# Patient Record
Sex: Male | Born: 1945 | Race: White | Hispanic: No | Marital: Married | Smoking: Former smoker
Health system: Southern US, Community
[De-identification: ages and names within clinical notes are randomized; demographics above are authoritative.]

## PROBLEM LIST (undated history)

## (undated) DIAGNOSIS — E785 Hyperlipidemia, unspecified: Secondary | ICD-10-CM

## (undated) DIAGNOSIS — I10 Essential (primary) hypertension: Secondary | ICD-10-CM

## (undated) HISTORY — PX: NECK SURGERY: SHX720

## (undated) HISTORY — PX: SHOULDER SURGERY: SHX246

---

## 2005-10-09 ENCOUNTER — Encounter: Admission: RE | Admit: 2005-10-09 | Discharge: 2005-10-09 | Payer: Self-pay | Admitting: Orthopedic Surgery

## 2015-08-19 DIAGNOSIS — M791 Myalgia: Secondary | ICD-10-CM | POA: Diagnosis not present

## 2015-08-19 DIAGNOSIS — M9904 Segmental and somatic dysfunction of sacral region: Secondary | ICD-10-CM | POA: Diagnosis not present

## 2015-08-19 DIAGNOSIS — M545 Low back pain: Secondary | ICD-10-CM | POA: Diagnosis not present

## 2015-08-19 DIAGNOSIS — M9903 Segmental and somatic dysfunction of lumbar region: Secondary | ICD-10-CM | POA: Diagnosis not present

## 2015-08-19 DIAGNOSIS — M9901 Segmental and somatic dysfunction of cervical region: Secondary | ICD-10-CM | POA: Diagnosis not present

## 2015-11-09 DIAGNOSIS — M545 Low back pain: Secondary | ICD-10-CM | POA: Diagnosis not present

## 2015-11-09 DIAGNOSIS — M791 Myalgia: Secondary | ICD-10-CM | POA: Diagnosis not present

## 2015-11-09 DIAGNOSIS — M9903 Segmental and somatic dysfunction of lumbar region: Secondary | ICD-10-CM | POA: Diagnosis not present

## 2015-11-09 DIAGNOSIS — M9901 Segmental and somatic dysfunction of cervical region: Secondary | ICD-10-CM | POA: Diagnosis not present

## 2015-11-09 DIAGNOSIS — M9904 Segmental and somatic dysfunction of sacral region: Secondary | ICD-10-CM | POA: Diagnosis not present

## 2015-11-19 DIAGNOSIS — M545 Low back pain: Secondary | ICD-10-CM | POA: Diagnosis not present

## 2015-11-19 DIAGNOSIS — M9901 Segmental and somatic dysfunction of cervical region: Secondary | ICD-10-CM | POA: Diagnosis not present

## 2015-11-19 DIAGNOSIS — M9903 Segmental and somatic dysfunction of lumbar region: Secondary | ICD-10-CM | POA: Diagnosis not present

## 2015-11-19 DIAGNOSIS — M791 Myalgia: Secondary | ICD-10-CM | POA: Diagnosis not present

## 2015-11-19 DIAGNOSIS — M9904 Segmental and somatic dysfunction of sacral region: Secondary | ICD-10-CM | POA: Diagnosis not present

## 2015-11-23 ENCOUNTER — Ambulatory Visit (INDEPENDENT_AMBULATORY_CARE_PROVIDER_SITE_OTHER): Payer: Medicare Other | Admitting: Family Medicine

## 2015-11-23 ENCOUNTER — Ambulatory Visit (INDEPENDENT_AMBULATORY_CARE_PROVIDER_SITE_OTHER): Payer: Medicare Other

## 2015-11-23 VITALS — BP 144/80 | HR 73 | Wt 182.0 lb

## 2015-11-23 DIAGNOSIS — M25562 Pain in left knee: Secondary | ICD-10-CM

## 2015-11-23 DIAGNOSIS — M25561 Pain in right knee: Secondary | ICD-10-CM

## 2015-11-23 DIAGNOSIS — M170 Bilateral primary osteoarthritis of knee: Secondary | ICD-10-CM | POA: Diagnosis not present

## 2015-11-23 NOTE — Assessment & Plan Note (Signed)
Likely meniscus tear. Patient had significant improvement following injection. Recheck in a few weeks. If not improved would consider MRI.

## 2015-11-23 NOTE — Patient Instructions (Signed)
Thank you for coming in today. Call or go to the ER if you develop a large red swollen joint with extreme pain or oozing puss.  Return in 2-4 weeks.   Meniscus Tear With Phase I Rehab The meniscus is a C-shaped cartilage structure, located in the knee joint between the thigh bone (femur) and the shinbone (tibia). Two menisci are located in each knee joint: the inner and outer meniscus. The meniscus acts as an adapter between the thigh bone and shinbone, allowing them to fit properly together. It also functions as a shock absorber, to reduce the stress placed on the knee joint and to help supply nutrients to the knee joint cartilage. As people age, the meniscus begins to harden and become more vulnerable to injury. Meniscus tears are a common injury, especially in older athletes. Inner meniscus tears are more common than outer meniscus tears.  SYMPTOMS   Pain in the knee, especially with standing or squatting with the affected leg.  Tenderness along the joint line.  Swelling in the knee joint (effusion), usually starting 1 to 2 days after injury.  Locking or catching of the knee joint, causing inability to straighten the knee completely.  Giving way or buckling of the knee. CAUSES  A meniscus tear occurs when a force is placed on the meniscus that is greater than it can handle. Common causes of injury include:  Direct hit (trauma) to the knee.  Twisting, pivoting, or cutting (rapidly changing direction while running), kneeling or squatting.  Without injury, due to aging. RISK INCREASES WITH:  Contact sports (football, rugby).  Sports in which cleats are used with pivoting (soccer, lacrosse) or sports in which good shoe grip and sudden change in direction are required (racquetball, basketball, squash).  Previous knee injury.  Associated knee injury, particularly ligament injuries.  Poor strength and flexibility. PREVENTION  Warm up and stretch properly before activity.  Maintain  physical fitness:  Strength, flexibility, and endurance.  Cardiovascular fitness.  Protect the knee with a brace or elastic bandage.  Wear properly fitted protective equipment (proper cleats for the surface). PROGNOSIS  Sometimes, meniscus tears heal on their own. However, definitive treatment requires surgery, followed by at least 6 weeks of recovery.  RELATED COMPLICATIONS   Recurring symptoms that result in a chronic problem.  Repeated knee injury, especially if sports are resumed too soon after injury or surgery.  Progression of the tear (the tear gets larger), if untreated.  Arthritis of the knee in later years (with or without surgery).  Complications of surgery, including infection, bleeding, injury to nerves (numbness, weakness, paralysis) continued pain, giving way, locking, nonhealing of meniscus (if repaired), need for further surgery, and knee stiffness (loss of motion). TREATMENT  Treatment first involves the use of ice and medicine, to reduce pain and inflammation. You may find using crutches to walk more comfortable. However, it is okay to bear weight on the injured knee, if the pain will allow it. Surgery is often advised as a definitive treatment. Surgery is performed through an incision near the joint (arthroscopically). The torn piece of the meniscus is removed, and if possible the joint cartilage is repaired. After surgery, the joint must be restrained. After restraint, it is important to perform strengthening and stretching exercises to help regain strength and a full range of motion. These exercises may be completed at home or with a therapist.  MEDICATION  If pain medicine is needed, nonsteroidal anti-inflammatory medicines (aspirin and ibuprofen), or other minor pain relievers (acetaminophen),  are often advised.  Do not take pain medicine for 7 days before surgery.  Prescription pain relievers may be given, if your caregiver thinks they are needed. Use only as  directed and only as much as you need. HEAT AND COLD  Cold treatment (icing) should be applied for 10 to 15 minutes every 2 to 3 hours for inflammation and pain, and immediately after activity that aggravates your symptoms. Use ice packs or an ice massage.  Heat treatment may be used before performing stretching and strengthening activities prescribed by your caregiver, physical therapist, or athletic trainer. Use a heat pack or a warm water soak. SEEK MEDICAL CARE IF:   Symptoms get worse or do not improve in 2 weeks, despite treatment.  New, unexplained symptoms develop. (Drugs used in treatment may produce side effects.) EXERCISES RANGE OF MOTION (ROM) AND STRETCHING EXERCISES - Meniscus Tear, Non-operative, Phase I These are some of the initial exercises with which you may start your rehabilitation program, until you see your caregiver again or until your symptoms are resolved. Remember:   These initial exercises are intended to be gentle. They will help you restore motion without increasing any swelling.  Completing these exercises allows less painful movement and prepares you for the more aggressive strengthening exercises in Phase II.  An effective stretch should be held for at least 30 seconds.  A stretch should never be painful. You should only feel a gentle lengthening or release in the stretched tissue. RANGE OF MOTION - Knee Flexion, Active  Lie on your back with both knees straight. (If this causes back discomfort, bend your healthy knee, placing your foot flat on the floor.)  Slowly slide your heel back toward your buttocks until you feel a gentle stretch in the front of your knee or thigh.  Hold for __________ seconds. Slowly slide your heel back to the starting position. Repeat __________ times. Complete this exercise __________ times per day.  RANGE OF MOTION - Knee Flexion and Extension, Active-Assisted  Sit on the edge of a table or chair with your thighs firmly  supported. It may be helpful to place a folded towel under the end of your right / left thigh.  Flexion (bending): Place the ankle of your healthy leg on top of the other ankle. Use your healthy leg to gently bend your right / left knee until you feel a mild tension across the top of your knee.  Hold for __________ seconds.  Extension (straightening): Switch your ankles so your right / left leg is on top. Use your healthy leg to straighten your right / left knee until you feel a mild tension on the backside of your knee.  Hold for __________ seconds. Repeat __________ times. Complete __________ times per day. STRETCH - Knee Flexion, Supine  Lie on the floor with your right / left heel and foot lightly touching the wall. (Place both feet on the wall if you do not use a door frame.)  Without using any effort, allow gravity to slide your foot down the wall slowly until you feel a gentle stretch in the front of your right / left knee.  Hold this stretch for __________ seconds. Then return the leg to the starting position, using your healthy leg for help, if needed. Repeat __________ times. Complete this stretch __________ times per day.  STRETCH - Knee Extension Sitting  Sit with your right / left leg/heel propped on another chair, coffee table, or foot stool.  Allow your leg muscles  to relax, letting gravity straighten out your knee.*  You should feel a stretch behind your right / left knee. Hold this position for __________ seconds. Repeat __________ times. Complete this stretch __________ times per day.  *Your physician, physical therapist or athletic trainer may instruct you place a __________ weight on your thigh, just above your kneecap, to deepen the stretch.  STRENGTHENING EXERCISES - Meniscus Tear, Non-operative, Phase I These exercises may help you when beginning to rehabilitate your injury. They may resolve your symptoms with or without further involvement from your physician,  physical therapist or athletic trainer. While completing these exercises, remember:   Muscles can gain both the endurance and the strength needed for everyday activities through controlled exercises.  Complete these exercises as instructed by your physician, physical therapist or athletic trainer. Progress the resistance and repetitions only as guided. STRENGTH - Quadriceps, Isometrics  Lie on your back with your right / left leg extended and your opposite knee bent.  Gradually tense the muscles in the front of your right / left thigh. You should see either your knee cap slide up toward your hip or increased dimpling just above the knee. This motion will push the back of the knee down toward the floor, mat, or bed on which you are lying.  Hold the muscle as tight as you can, without increasing your pain, for __________ seconds.  Relax the muscles slowly and completely between each repetition. Repeat __________ times. Complete this exercise __________ times per day.  STRENGTH - Quadriceps, Short Arcs   Lie on your back. Place a __________ inch towel roll under your right / left knee, so that the knee bends slightly.  Raise only your lower leg by tightening the muscles in the front of your thigh. Do not allow your thigh to rise.  Hold this position for __________ seconds. Repeat __________ times. Complete this exercise __________ times per day.  OPTIONAL ANKLE WEIGHTS: Begin with ____________________, but DO NOT exceed ____________________. Increase in 1 pound/0.5 kilogram increments. STRENGTH - Quadriceps, Straight Leg Raises  Quality counts! Watch for signs that the quadriceps muscle is working, to be sure you are strengthening the correct muscles and not "cheating" by substituting with healthier muscles.  Lay on your back with your right / left leg extended and your opposite knee bent.  Tense the muscles in the front of your right / left thigh. You should see either your knee cap slide  up or increased dimpling just above the knee. Your thigh may even shake a bit.  Tighten these muscles even more and raise your leg 4 to 6 inches off the floor. Hold for __________ seconds.  Keeping these muscles tense, lower your leg.  Relax the muscles slowly and completely in between each repetition. Repeat __________ times. Complete this exercise __________ times per day.  STRENGTH - Hamstring, Curls   Lay on your stomach with your legs extended. (If you lay on a bed, your feet may hang over the edge.)  Tighten the muscles in the back of your thigh to bend your right / left knee up to 90 degrees. Keep your hips flat on the bed.  Hold this position for __________ seconds.  Slowly lower your leg back to the starting position. Repeat __________ times. Complete this exercise __________ times per day.  STRENGTH - Quadriceps, Squats  Stand in a door frame so that your feet and knees are in line with the frame.  Use your hands for balance, not support, on the  frame.  Slowly lower your weight, bending at the hips and knees. Keep your lower legs upright so that they are parallel with the door frame. Squat only within the range that does not increase your knee pain. Never let your hips drop below your knees.  Slowly return upright, pushing with your legs, not pulling with your hands. Repeat __________ times. Complete this exercise __________ times per day.  STRENGTH - Quad/VMO, Isometric   Sit in a chair with your right / left knee slightly bent. With your fingertips, feel the VMO muscle just above the inside of your knee. The VMO is important in controlling the position of your kneecap.  Keeping your fingertips on this muscle. Without actually moving your leg, attempt to drive your knee down as if straightening your leg. You should feel your VMO tense. If you have a difficult time, you may wish to try the same exercise on your healthy knee first.  Tense this muscle as hard as you can  without increasing any knee pain.  Hold for __________ seconds. Relax the muscles slowly and completely in between each repetition. Repeat __________ times. Complete exercise __________ times per day.    This information is not intended to replace advice given to you by your health care provider. Make sure you discuss any questions you have with your health care provider.   Document Released: 08/15/1998 Document Revised: 12/16/2014 Document Reviewed: 11/13/2008 Elsevier Interactive Patient Education [employer].

## 2015-11-23 NOTE — Progress Notes (Signed)
   Cody Ware is a 70 y.o. male who presents to [facility] today for left knee pain. Patient has a two-week history of left medial knee pain. Symptoms started after playing golf. He denies any specific injury locking catching or giving way. Symptoms are moderate. He's tried some over-the-counter medicines have helped a bit. He denies any significant swelling.   No past medical history on file. No past surgical history on file. Social History  Substance Use Topics  . Smoking status: Not on file  . Smokeless tobacco: Not on file  . Alcohol Use: Not on file   family history is not on file.  ROS:  No headache, visual changes, nausea, vomiting, diarrhea, constipation, dizziness, abdominal pain, skin rash, fevers, chills, night sweats, weight loss, swollen lymph nodes, body aches, joint swelling, muscle aches, chest pain, shortness of breath, mood changes, visual or auditory hallucinations.    Medications: Current Outpatient Prescriptions  Medication Sig Dispense Refill  . amLODipine (NORVASC) 5 MG tablet     . atorvastatin (LIPITOR) 10 MG tablet     . fenofibrate 160 MG tablet     . lisinopril-hydrochlorothiazide (PRINZIDE,ZESTORETIC) 20-12.5 MG tablet      No current facility-administered medications for this visit.   No Known Allergies   Exam:  BP 144/80 mmHg  Pulse 73  Wt 182 lb (82.555 kg) General: Well Developed, well nourished, and in no acute distress.  Neuro/Psych: Alert and oriented x3, extra-ocular muscles intact, able to move all 4 extremities, sensation grossly intact. Skin: Warm and dry, no rashes noted.  Respiratory: Not using accessory muscles, speaking in full sentences, trachea midline.  Cardiovascular: Pulses palpable, no extremity edema. Abdomen: Does not appear distended. MSK: Left knee relatively normal appearing no significant effusion. Range of motion 0-120 with 1+ retropatellar crepitation Tender to  palpation medial joint line. Nontender lateral joint line. Negative McMurray's testing but positive Thessaly's tests. Stable ligamentous exam to valgus and varus stress testing. Normal anterior posterior drawer test. Normal gait.   X-ray left knee: Minimal DJD  Procedure: Real-time Ultrasound Guided Injection of left knee  Device: GE Logiq E  Images permanently stored and available for review in the ultrasound unit. Verbal informed consent obtained. Discussed risks and benefits of procedure. Warned about infection bleeding damage to structures skin hypopigmentation and fat atrophy among others. Patient expresses understanding and agreement Time-out conducted.  Noted no overlying erythema, induration, or other signs of local infection.  Skin prepped in a sterile fashion.  Local anesthesia: Topical Ethyl chloride.  With sterile technique and under real time ultrasound guidance: 80 mg of Kenalog and 4 mL of Marcaine injected easily.  Completed without difficulty  Pain immediately resolved suggesting accurate placement of the medication.  Advised to call if fevers/chills, erythema, induration, drainage, or persistent bleeding.  Images permanently stored and available for review in the ultrasound unit.  Impression: Technically successful ultrasound guided injection.     No results found for this or any previous visit (from the past 24 hour(s)). No results found.   Please see individual assessment and plan sections.

## 2015-11-24 NOTE — Progress Notes (Signed)
Quick Note:  Mild arthritis is present. ______

## 2016-01-14 DIAGNOSIS — I10 Essential (primary) hypertension: Secondary | ICD-10-CM | POA: Diagnosis not present

## 2016-01-14 DIAGNOSIS — Z1159 Encounter for screening for other viral diseases: Secondary | ICD-10-CM | POA: Diagnosis not present

## 2016-01-14 DIAGNOSIS — Z79899 Other long term (current) drug therapy: Secondary | ICD-10-CM | POA: Diagnosis not present

## 2016-01-14 DIAGNOSIS — Z125 Encounter for screening for malignant neoplasm of prostate: Secondary | ICD-10-CM | POA: Diagnosis not present

## 2016-01-14 DIAGNOSIS — E785 Hyperlipidemia, unspecified: Secondary | ICD-10-CM | POA: Diagnosis not present

## 2016-03-01 DIAGNOSIS — M7671 Peroneal tendinitis, right leg: Secondary | ICD-10-CM | POA: Diagnosis not present

## 2016-03-01 DIAGNOSIS — M722 Plantar fascial fibromatosis: Secondary | ICD-10-CM | POA: Diagnosis not present

## 2016-03-28 DIAGNOSIS — Z23 Encounter for immunization: Secondary | ICD-10-CM | POA: Diagnosis not present

## 2016-04-25 IMAGING — CR DG KNEE 1-2V*R*
4 series · 4 of 4 positions shown · non-contrast
Comparison: None in PACs

CLINICAL DATA: Medial left knee pain for several months, right knee
images obtained for comparison. No report of injury.

EXAM:
RIGHT KNEE - 1-2 VIEW; LEFT KNEE - COMPLETE 4+ VIEW

[knee ap]
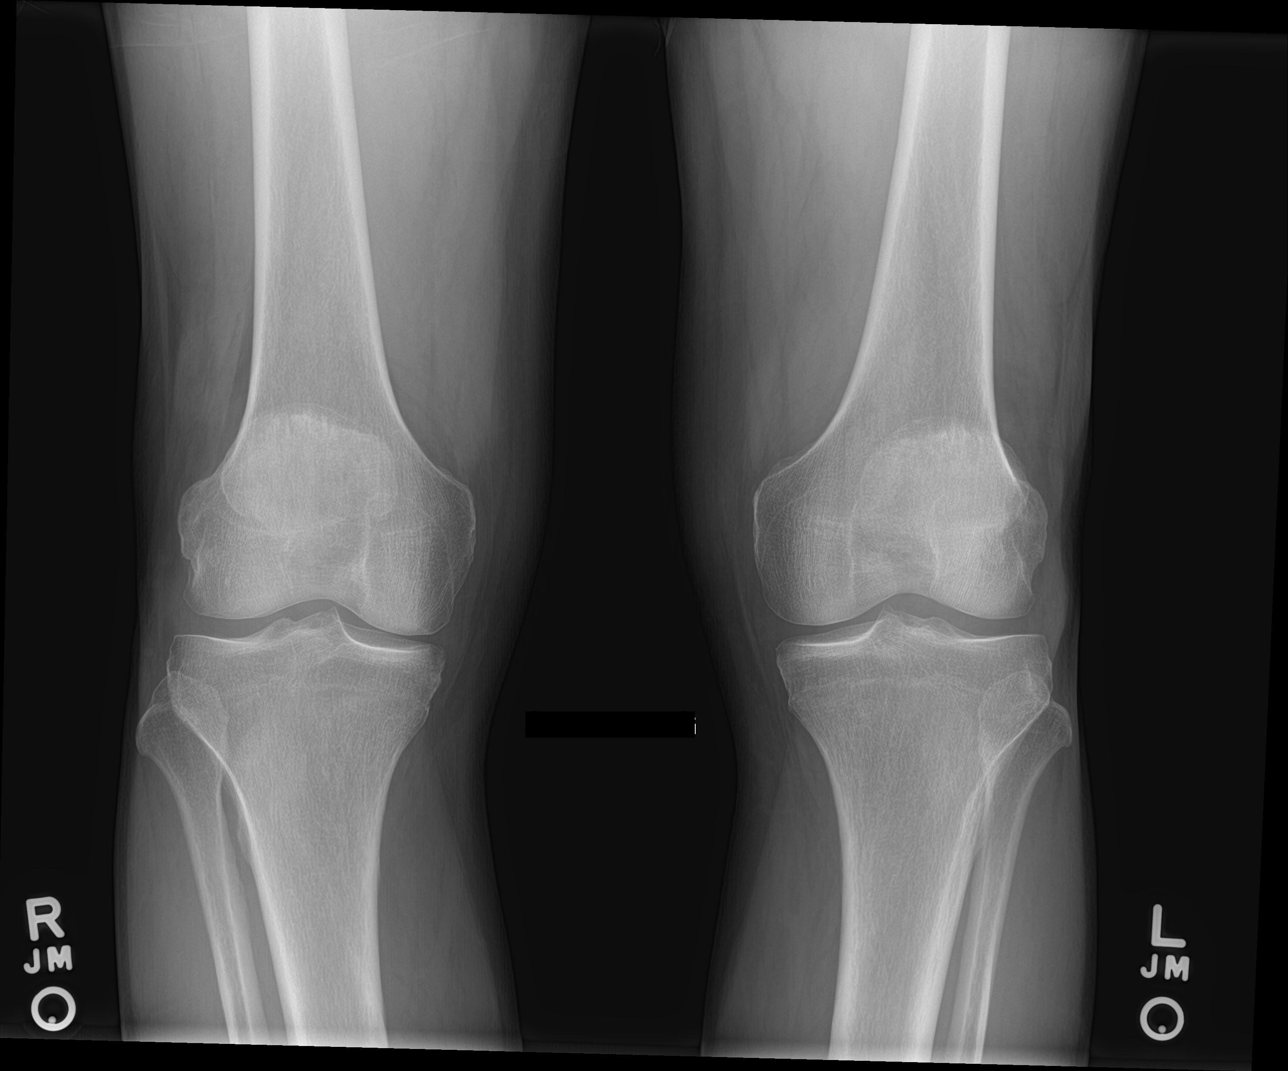

[tunnel]
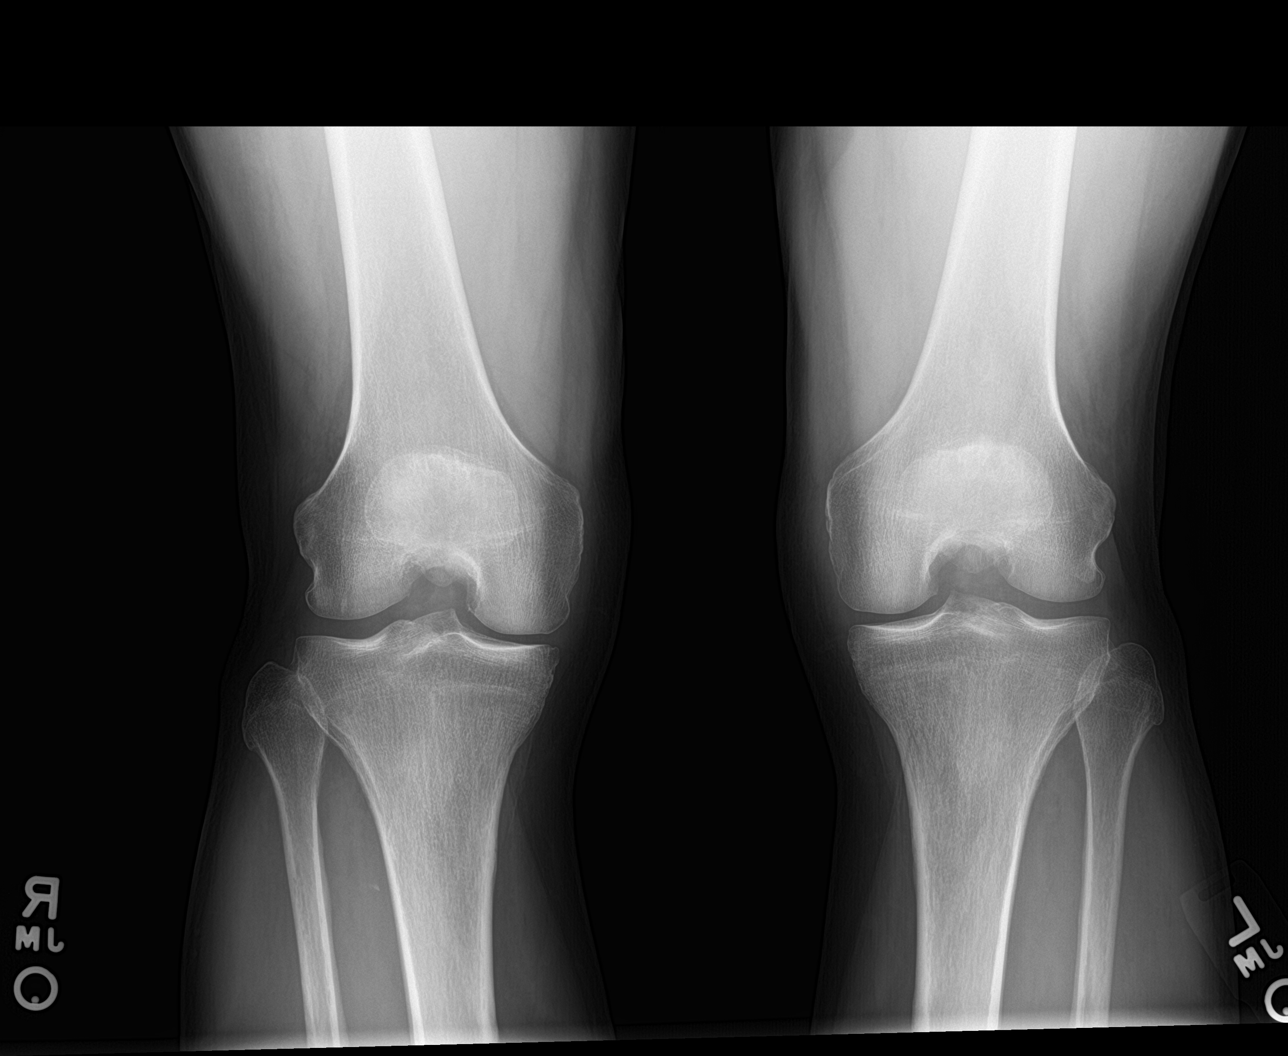

[knee lat]
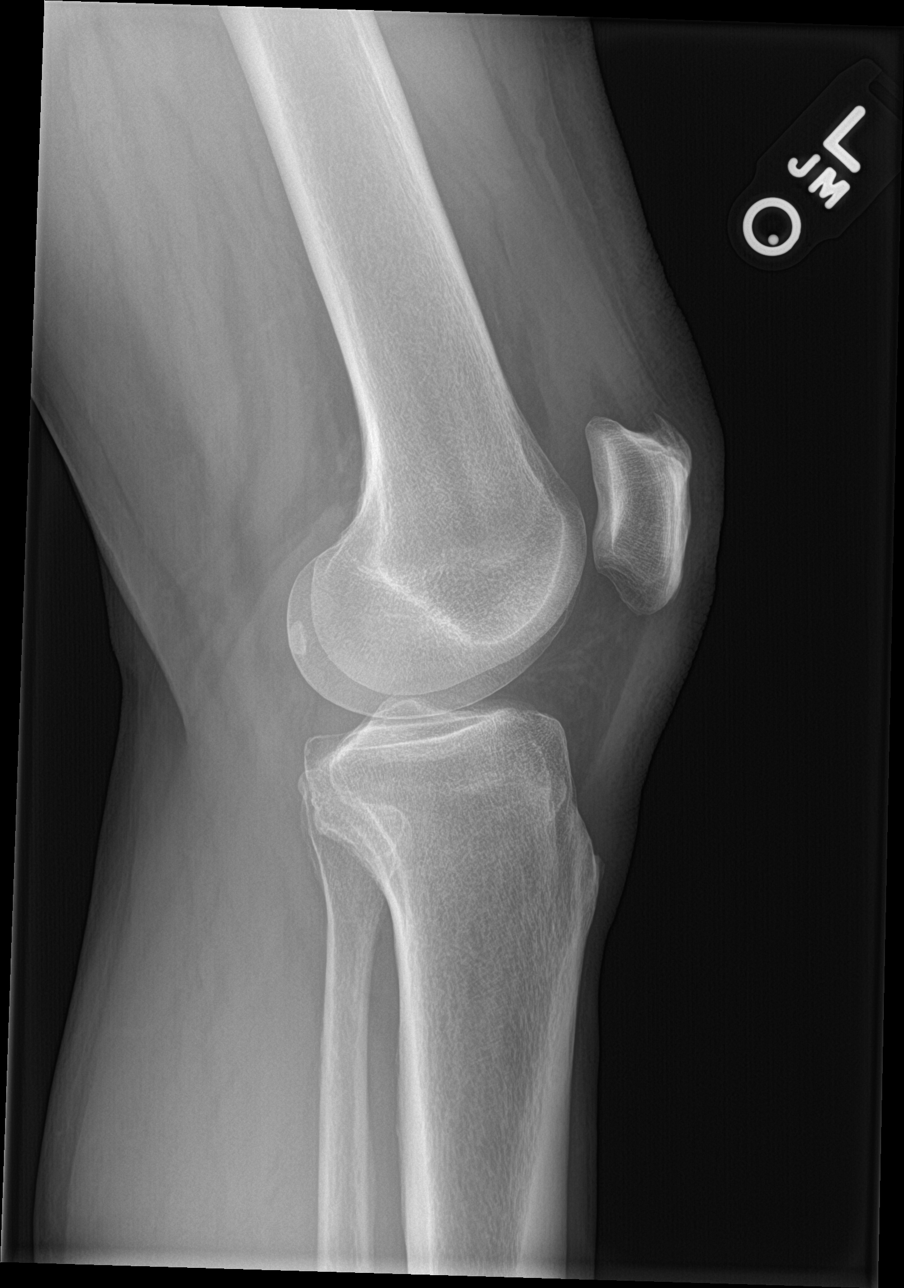

[knee sunrise]
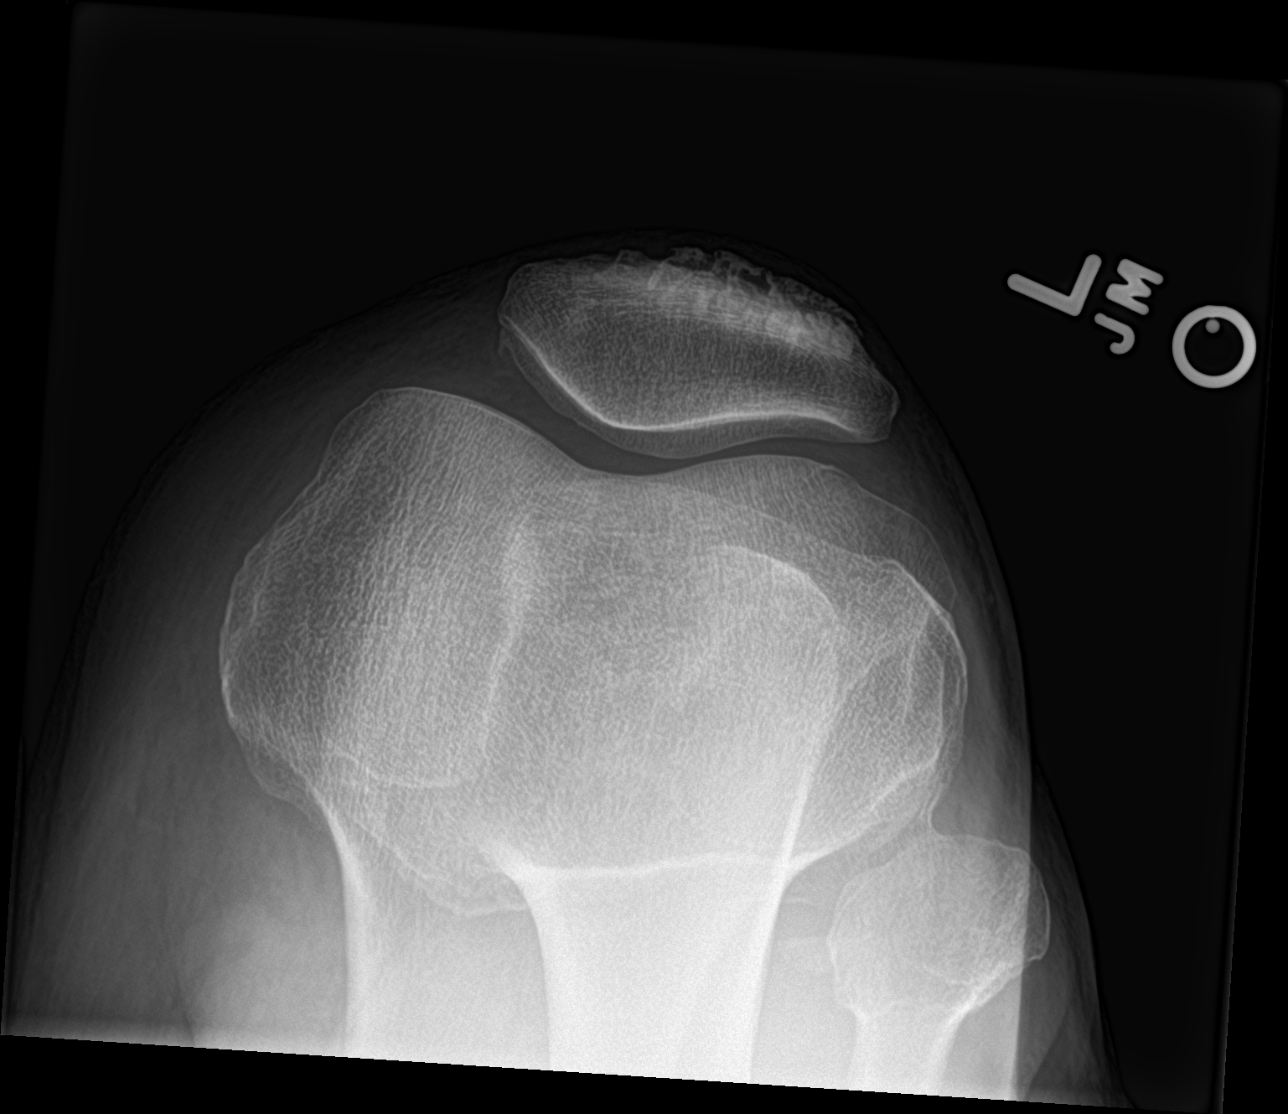

[4 of 4 positions shown; findings below may reference images not displayed]

FINDINGS: The bones are adequately mineralized. There is mild narrowing of the
medial joint spaces bilaterally. There is beaking of the medial
tibial spines bilaterally. The proximal fibulas are intact. There is
no acute fracture nor dislocation of the left knee. There is no
joint effusion.
IMPRESSION: There is mild degenerative narrowing of the medial joint
compartments bilaterally. There is no acute bony abnormality of the
left knee.

## 2016-05-11 DIAGNOSIS — J029 Acute pharyngitis, unspecified: Secondary | ICD-10-CM | POA: Diagnosis not present

## 2016-05-11 DIAGNOSIS — J018 Other acute sinusitis: Secondary | ICD-10-CM | POA: Diagnosis not present

## 2016-05-11 DIAGNOSIS — R05 Cough: Secondary | ICD-10-CM | POA: Diagnosis not present

## 2016-06-23 DIAGNOSIS — K5730 Diverticulosis of large intestine without perforation or abscess without bleeding: Secondary | ICD-10-CM | POA: Diagnosis not present

## 2016-06-23 DIAGNOSIS — Z86010 Personal history of colonic polyps: Secondary | ICD-10-CM | POA: Diagnosis not present

## 2016-08-25 DIAGNOSIS — Z23 Encounter for immunization: Secondary | ICD-10-CM | POA: Diagnosis not present

## 2016-08-25 DIAGNOSIS — I10 Essential (primary) hypertension: Secondary | ICD-10-CM | POA: Diagnosis not present

## 2016-08-25 DIAGNOSIS — E785 Hyperlipidemia, unspecified: Secondary | ICD-10-CM | POA: Diagnosis not present

## 2017-02-16 DIAGNOSIS — E785 Hyperlipidemia, unspecified: Secondary | ICD-10-CM | POA: Diagnosis not present

## 2017-02-16 DIAGNOSIS — I10 Essential (primary) hypertension: Secondary | ICD-10-CM | POA: Diagnosis not present

## 2017-02-16 DIAGNOSIS — Z0000 Encounter for general adult medical examination without abnormal findings: Secondary | ICD-10-CM | POA: Diagnosis not present

## 2017-02-23 DIAGNOSIS — Z79899 Other long term (current) drug therapy: Secondary | ICD-10-CM | POA: Diagnosis not present

## 2017-02-23 DIAGNOSIS — E785 Hyperlipidemia, unspecified: Secondary | ICD-10-CM | POA: Diagnosis not present

## 2017-02-23 DIAGNOSIS — I10 Essential (primary) hypertension: Secondary | ICD-10-CM | POA: Diagnosis not present

## 2017-04-19 ENCOUNTER — Encounter: Payer: Self-pay | Admitting: *Deleted

## 2017-04-19 ENCOUNTER — Emergency Department (INDEPENDENT_AMBULATORY_CARE_PROVIDER_SITE_OTHER)
Admission: EM | Admit: 2017-04-19 | Discharge: 2017-04-19 | Disposition: A | Discharge status: Home / Self Care | Payer: Medicare Other | Source: Home / Self Care | Attending: Family Medicine | Admitting: Family Medicine

## 2017-04-19 DIAGNOSIS — J069 Acute upper respiratory infection, unspecified: Secondary | ICD-10-CM

## 2017-04-19 HISTORY — DX: Essential (primary) hypertension: I10

## 2017-04-19 HISTORY — DX: Hyperlipidemia, unspecified: E78.5

## 2017-04-19 MED ORDER — AZITHROMYCIN 250 MG PO TABS: ORAL_TABLET | ORAL | 0 refills | Status: AC

## 2017-04-19 NOTE — ED Triage Notes (Signed)
Pt c/o nonproductive cough and sore throat x 5-6 days. Denies fever.

## 2017-04-19 NOTE — ED Provider Notes (Signed)
[facility]    CSN: [id number] Arrival date & time: 04/19/17  1142     History   Chief Complaint Chief Complaint  Patient presents with  . Cough    HPI Cody Ware is a 71 y.o. male.   Patient complains of onset of a non-productive cough about 5 to 6 days ago, without sinus congestion, fever, or sore throat.  His throat has now become sore from coughing (not affected by swallowing).  His cough awakens him at night.  No pleuritic pain or shortness of breath.        Past Medical History:  Diagnosis Date  . Hyperlipidemia   . Hypertension     Patient Active Problem List   Diagnosis Date Noted  . Left medial knee pain 11/23/2015    Past Surgical History:  Procedure Laterality Date  . NECK SURGERY    . SHOULDER SURGERY Bilateral        Home Medications    Prior to Admission medications   Medication Sig Start Date End Date Taking? Authorizing Provider  amLODipine (NORVASC) 5 MG tablet  10/14/15  Yes [provider]  atorvastatin (LIPITOR) 10 MG tablet  10/14/15  Yes [provider]  fenofibrate 160 MG tablet  10/14/15  Yes [provider]  lisinopril-hydrochlorothiazide (PRINZIDE,ZESTORETIC) 20-12.5 MG tablet  10/14/15  Yes [provider]  azithromycin (ZITHROMAX Z-PAK) 250 MG tablet Take 2 tabs today; then begin one tab once daily for 4 more days. 04/19/17   Kandra Nicolas, MD    Family History Family History  Problem Relation Age of Onset  . Breast cancer Mother   . Kidney disease Father     Social History Social History  Substance Use Topics  . Smoking status: Former Research scientist (life sciences)  . Smokeless tobacco: Never Used  . Alcohol use No     Allergies   Patient has no known allergies.   Review of Systems Review of Systems + sore throat + cough No pleuritic pain No wheezing No nasal congestion No post-nasal drainage No sinus pain/pressure No itchy/red eyes No earache No hemoptysis No SOB No  fever/chills No nausea No vomiting No abdominal pain No diarrhea No urinary symptoms No skin rash No fatigue No myalgias No headache Used OTC meds without relief   Physical Exam Triage Vital Signs ED Triage Vitals  Enc Vitals Group     BP 04/19/17 1159 136/75     Pulse Rate 04/19/17 1159 69     Resp 04/19/17 1159 16     Temp 04/19/17 1159 98.5 F (36.9 C)     Temp Source 04/19/17 1159 Oral     SpO2 04/19/17 1159 98 %     Weight 04/19/17 1200 175 lb (79.4 kg)     Height --      Head Circumference --      Peak Flow --      Pain Score 04/19/17 1200 0     Pain Loc --      Pain Edu? --      Excl. in [place]? --    No data found.   Updated Vital Signs BP 136/75 (BP Location: Left Arm)   Pulse 69   Temp 98.5 F (36.9 C) (Oral)   Resp 16   Wt 175 lb (79.4 kg)   SpO2 98%   Visual Acuity Right Eye Distance:   Left Eye Distance:   Bilateral Distance:    Right Eye Near:   Left Eye Near:  Bilateral Near:     Physical Exam Nursing notes and Vital Signs reviewed. Appearance:  Patient appears stated age, and in no acute distress Eyes:  Pupils are equal, round, and reactive to light and accomodation.  Extraocular movement is intact.  Conjunctivae are not inflamed  Ears:  Canals normal.  Tympanic membranes normal.  Nose:  Mildly congested turbinates.  No sinus tenderness.   Pharynx:  Normal Neck:  Supple.  No adenopathy  Lungs:  Clear to auscultation.  Breath sounds are equal.  Moving air well. Heart:  Regular rate and rhythm without murmurs, rubs, or gallops.  Abdomen:  Nontender without masses or hepatosplenomegaly.  Bowel sounds are present.  No CVA or flank tenderness.  Extremities:  No edema.  Skin:  No rash present.    UC Treatments / Results  Labs (all labs ordered are listed, but only abnormal results are displayed) Labs Reviewed - No data to display  EKG  EKG Interpretation None       Radiology No results found.  Procedures Procedures (including  critical care time)  Medications Ordered in UC Medications - No data to display   Initial Impression / Assessment and Plan / UC Course  I have reviewed the triage vital signs and the nursing notes.  Pertinent labs & imaging results that were available during my care of the patient were reviewed by me and considered in my medical decision making (see chart for details).    Begin Z-pak for atypical coverage. Take plain guaifenesin (1200mg  extended release tabs such as Mucinex) twice daily, with plenty of water, for cough and congestion.  Get adequate rest.   May take Delsym Cough Suppressant at bedtime for nighttime cough.  Try warm salt water gargles for sore throat.  Stop all antihistamines for now, and other non-prescription cough/cold preparations. Followup with Family Doctor if not improved in one week.     Final Clinical Impressions(s) / UC Diagnoses   Final diagnoses:  Upper respiratory tract infection, unspecified type    New Prescriptions New Prescriptions   AZITHROMYCIN (ZITHROMAX Z-PAK) 250 MG TABLET    Take 2 tabs today; then begin one tab once daily for 4 more days.         Kandra Nicolas, MD 04/19/17 1254

## 2017-04-19 NOTE — Discharge Instructions (Signed)
Take plain guaifenesin (1200mg extended release tabs such as Mucinex) twice daily, with plenty of water, for cough and congestion.  Get adequate rest.   °May take Delsym Cough Suppressant at bedtime for nighttime cough.  °Try warm salt water gargles for sore throat.  °Stop all antihistamines for now, and other non-prescription cough/cold preparations. °  °

## 2017-05-14 DIAGNOSIS — Z23 Encounter for immunization: Secondary | ICD-10-CM | POA: Diagnosis not present

## 2017-08-24 DIAGNOSIS — D649 Anemia, unspecified: Secondary | ICD-10-CM | POA: Diagnosis not present

## 2017-08-24 DIAGNOSIS — E782 Mixed hyperlipidemia: Secondary | ICD-10-CM | POA: Diagnosis not present

## 2017-08-24 DIAGNOSIS — M542 Cervicalgia: Secondary | ICD-10-CM | POA: Diagnosis not present

## 2017-08-24 DIAGNOSIS — I10 Essential (primary) hypertension: Secondary | ICD-10-CM | POA: Diagnosis not present

## 2017-09-19 DIAGNOSIS — D509 Iron deficiency anemia, unspecified: Secondary | ICD-10-CM | POA: Diagnosis not present

## 2017-09-19 DIAGNOSIS — K921 Melena: Secondary | ICD-10-CM | POA: Diagnosis not present

## 2017-10-26 DIAGNOSIS — I10 Essential (primary) hypertension: Secondary | ICD-10-CM | POA: Diagnosis not present

## 2017-10-26 DIAGNOSIS — Z87891 Personal history of nicotine dependence: Secondary | ICD-10-CM | POA: Diagnosis not present

## 2017-10-26 DIAGNOSIS — Z79899 Other long term (current) drug therapy: Secondary | ICD-10-CM | POA: Diagnosis not present

## 2017-10-26 DIAGNOSIS — K2210 Ulcer of esophagus without bleeding: Secondary | ICD-10-CM | POA: Diagnosis not present

## 2017-10-26 DIAGNOSIS — E785 Hyperlipidemia, unspecified: Secondary | ICD-10-CM | POA: Diagnosis not present

## 2017-10-26 DIAGNOSIS — D509 Iron deficiency anemia, unspecified: Secondary | ICD-10-CM | POA: Diagnosis not present

## 2019-06-12 DIAGNOSIS — D229 Melanocytic nevi, unspecified: Secondary | ICD-10-CM | POA: Diagnosis not present

## 2019-06-12 DIAGNOSIS — D649 Anemia, unspecified: Secondary | ICD-10-CM | POA: Diagnosis not present

## 2019-06-12 DIAGNOSIS — E782 Mixed hyperlipidemia: Secondary | ICD-10-CM | POA: Diagnosis not present

## 2019-06-12 DIAGNOSIS — M542 Cervicalgia: Secondary | ICD-10-CM | POA: Diagnosis not present

## 2019-06-12 DIAGNOSIS — I10 Essential (primary) hypertension: Secondary | ICD-10-CM | POA: Diagnosis not present

## 2019-06-12 DIAGNOSIS — G8929 Other chronic pain: Secondary | ICD-10-CM | POA: Diagnosis not present

## 2019-07-18 DIAGNOSIS — Z1211 Encounter for screening for malignant neoplasm of colon: Secondary | ICD-10-CM | POA: Diagnosis not present

## 2019-09-27 DIAGNOSIS — H4423 Degenerative myopia, bilateral: Secondary | ICD-10-CM | POA: Diagnosis not present

## 2019-09-27 DIAGNOSIS — H3341 Traction detachment of retina, right eye: Secondary | ICD-10-CM | POA: Diagnosis not present

## 2019-09-27 DIAGNOSIS — H43813 Vitreous degeneration, bilateral: Secondary | ICD-10-CM | POA: Diagnosis not present

## 2019-09-27 DIAGNOSIS — H33021 Retinal detachment with multiple breaks, right eye: Secondary | ICD-10-CM | POA: Diagnosis not present

## 2019-10-12 DIAGNOSIS — Z01818 Encounter for other preprocedural examination: Secondary | ICD-10-CM | POA: Diagnosis not present

## 2019-10-14 DIAGNOSIS — M1990 Unspecified osteoarthritis, unspecified site: Secondary | ICD-10-CM | POA: Diagnosis not present

## 2019-10-14 DIAGNOSIS — Z79899 Other long term (current) drug therapy: Secondary | ICD-10-CM | POA: Diagnosis not present

## 2019-10-14 DIAGNOSIS — H3341 Traction detachment of retina, right eye: Secondary | ICD-10-CM | POA: Diagnosis not present

## 2019-10-14 DIAGNOSIS — H33021 Retinal detachment with multiple breaks, right eye: Secondary | ICD-10-CM | POA: Diagnosis not present

## 2019-10-14 DIAGNOSIS — E785 Hyperlipidemia, unspecified: Secondary | ICD-10-CM | POA: Diagnosis not present

## 2019-10-14 DIAGNOSIS — H3521 Other non-diabetic proliferative retinopathy, right eye: Secondary | ICD-10-CM | POA: Diagnosis not present

## 2019-10-14 DIAGNOSIS — I10 Essential (primary) hypertension: Secondary | ICD-10-CM | POA: Diagnosis not present

## 2019-11-08 DIAGNOSIS — H43812 Vitreous degeneration, left eye: Secondary | ICD-10-CM | POA: Diagnosis not present

## 2019-11-08 DIAGNOSIS — T85398A Other mechanical complication of other ocular prosthetic devices, implants and grafts, initial encounter: Secondary | ICD-10-CM | POA: Diagnosis not present

## 2019-12-03 DIAGNOSIS — H43812 Vitreous degeneration, left eye: Secondary | ICD-10-CM | POA: Diagnosis not present

## 2019-12-03 DIAGNOSIS — H4423 Degenerative myopia, bilateral: Secondary | ICD-10-CM | POA: Diagnosis not present

## 2019-12-03 DIAGNOSIS — H3341 Traction detachment of retina, right eye: Secondary | ICD-10-CM | POA: Diagnosis not present

## 2019-12-05 DIAGNOSIS — I10 Essential (primary) hypertension: Secondary | ICD-10-CM | POA: Diagnosis not present

## 2019-12-05 DIAGNOSIS — E782 Mixed hyperlipidemia: Secondary | ICD-10-CM | POA: Diagnosis not present

## 2019-12-05 DIAGNOSIS — R739 Hyperglycemia, unspecified: Secondary | ICD-10-CM | POA: Diagnosis not present

## 2019-12-05 DIAGNOSIS — D649 Anemia, unspecified: Secondary | ICD-10-CM | POA: Diagnosis not present

## 2019-12-05 DIAGNOSIS — R3911 Hesitancy of micturition: Secondary | ICD-10-CM | POA: Diagnosis not present

## 2019-12-05 DIAGNOSIS — G8929 Other chronic pain: Secondary | ICD-10-CM | POA: Diagnosis not present

## 2019-12-05 DIAGNOSIS — Z9189 Other specified personal risk factors, not elsewhere classified: Secondary | ICD-10-CM | POA: Diagnosis not present

## 2019-12-05 DIAGNOSIS — Z125 Encounter for screening for malignant neoplasm of prostate: Secondary | ICD-10-CM | POA: Diagnosis not present

## 2019-12-05 DIAGNOSIS — Z0000 Encounter for general adult medical examination without abnormal findings: Secondary | ICD-10-CM | POA: Diagnosis not present

## 2019-12-05 DIAGNOSIS — M542 Cervicalgia: Secondary | ICD-10-CM | POA: Diagnosis not present

## 2019-12-05 DIAGNOSIS — D229 Melanocytic nevi, unspecified: Secondary | ICD-10-CM | POA: Diagnosis not present

## 2020-01-14 DIAGNOSIS — H4423 Degenerative myopia, bilateral: Secondary | ICD-10-CM | POA: Diagnosis not present

## 2020-01-14 DIAGNOSIS — T85398A Other mechanical complication of other ocular prosthetic devices, implants and grafts, initial encounter: Secondary | ICD-10-CM | POA: Diagnosis not present

## 2020-01-14 DIAGNOSIS — H35372 Puckering of macula, left eye: Secondary | ICD-10-CM | POA: Diagnosis not present

## 2020-01-14 DIAGNOSIS — H3341 Traction detachment of retina, right eye: Secondary | ICD-10-CM | POA: Diagnosis not present

## 2020-01-31 DIAGNOSIS — H3341 Traction detachment of retina, right eye: Secondary | ICD-10-CM | POA: Diagnosis not present

## 2020-03-03 DIAGNOSIS — H35372 Puckering of macula, left eye: Secondary | ICD-10-CM | POA: Diagnosis not present

## 2020-03-03 DIAGNOSIS — R0683 Snoring: Secondary | ICD-10-CM | POA: Diagnosis not present

## 2020-03-03 DIAGNOSIS — Z9189 Other specified personal risk factors, not elsewhere classified: Secondary | ICD-10-CM | POA: Diagnosis not present

## 2020-03-03 DIAGNOSIS — H43812 Vitreous degeneration, left eye: Secondary | ICD-10-CM | POA: Diagnosis not present

## 2020-03-03 DIAGNOSIS — H3341 Traction detachment of retina, right eye: Secondary | ICD-10-CM | POA: Diagnosis not present

## 2020-03-18 DIAGNOSIS — H3341 Traction detachment of retina, right eye: Secondary | ICD-10-CM | POA: Diagnosis not present

## 2020-03-18 DIAGNOSIS — T85398A Other mechanical complication of other ocular prosthetic devices, implants and grafts, initial encounter: Secondary | ICD-10-CM | POA: Diagnosis not present

## 2020-03-27 DIAGNOSIS — H35341 Macular cyst, hole, or pseudohole, right eye: Secondary | ICD-10-CM | POA: Diagnosis not present

## 2020-03-27 DIAGNOSIS — H3341 Traction detachment of retina, right eye: Secondary | ICD-10-CM | POA: Diagnosis not present

## 2020-04-03 DIAGNOSIS — H35341 Macular cyst, hole, or pseudohole, right eye: Secondary | ICD-10-CM | POA: Diagnosis not present

## 2020-04-03 DIAGNOSIS — M1990 Unspecified osteoarthritis, unspecified site: Secondary | ICD-10-CM | POA: Diagnosis not present

## 2020-04-03 DIAGNOSIS — I10 Essential (primary) hypertension: Secondary | ICD-10-CM | POA: Diagnosis not present

## 2020-04-03 DIAGNOSIS — G4730 Sleep apnea, unspecified: Secondary | ICD-10-CM | POA: Diagnosis not present

## 2020-04-03 DIAGNOSIS — H3321 Serous retinal detachment, right eye: Secondary | ICD-10-CM | POA: Diagnosis not present

## 2020-04-03 DIAGNOSIS — H3341 Traction detachment of retina, right eye: Secondary | ICD-10-CM | POA: Diagnosis not present

## 2020-04-03 DIAGNOSIS — H3521 Other non-diabetic proliferative retinopathy, right eye: Secondary | ICD-10-CM | POA: Diagnosis not present

## 2020-04-03 DIAGNOSIS — E785 Hyperlipidemia, unspecified: Secondary | ICD-10-CM | POA: Diagnosis not present

## 2020-04-06 DIAGNOSIS — Z20822 Contact with and (suspected) exposure to covid-19: Secondary | ICD-10-CM | POA: Diagnosis not present

## 2020-05-05 DIAGNOSIS — H35372 Puckering of macula, left eye: Secondary | ICD-10-CM | POA: Diagnosis not present

## 2020-05-05 DIAGNOSIS — H35341 Macular cyst, hole, or pseudohole, right eye: Secondary | ICD-10-CM | POA: Diagnosis not present

## 2020-05-05 DIAGNOSIS — H3341 Traction detachment of retina, right eye: Secondary | ICD-10-CM | POA: Diagnosis not present

## 2020-05-06 DIAGNOSIS — Z9189 Other specified personal risk factors, not elsewhere classified: Secondary | ICD-10-CM | POA: Diagnosis not present

## 2020-05-06 DIAGNOSIS — G4733 Obstructive sleep apnea (adult) (pediatric): Secondary | ICD-10-CM | POA: Diagnosis not present

## 2020-05-06 DIAGNOSIS — I10 Essential (primary) hypertension: Secondary | ICD-10-CM | POA: Diagnosis not present

## 2020-05-06 DIAGNOSIS — R0683 Snoring: Secondary | ICD-10-CM | POA: Diagnosis not present

## 2020-06-01 DIAGNOSIS — R0683 Snoring: Secondary | ICD-10-CM | POA: Diagnosis not present

## 2020-06-05 DIAGNOSIS — H35341 Macular cyst, hole, or pseudohole, right eye: Secondary | ICD-10-CM | POA: Diagnosis not present

## 2020-06-05 DIAGNOSIS — H3341 Traction detachment of retina, right eye: Secondary | ICD-10-CM | POA: Diagnosis not present

## 2020-07-24 DIAGNOSIS — T85398D Other mechanical complication of other ocular prosthetic devices, implants and grafts, subsequent encounter: Secondary | ICD-10-CM | POA: Diagnosis not present

## 2020-07-24 DIAGNOSIS — H59811 Chorioretinal scars after surgery for detachment, right eye: Secondary | ICD-10-CM | POA: Diagnosis not present

## 2020-07-24 DIAGNOSIS — H35341 Macular cyst, hole, or pseudohole, right eye: Secondary | ICD-10-CM | POA: Diagnosis not present

## 2020-08-28 DIAGNOSIS — H43813 Vitreous degeneration, bilateral: Secondary | ICD-10-CM | POA: Diagnosis not present

## 2020-08-28 DIAGNOSIS — H59811 Chorioretinal scars after surgery for detachment, right eye: Secondary | ICD-10-CM | POA: Diagnosis not present

## 2020-08-28 DIAGNOSIS — T85398A Other mechanical complication of other ocular prosthetic devices, implants and grafts, initial encounter: Secondary | ICD-10-CM | POA: Diagnosis not present

## 2020-08-28 DIAGNOSIS — H35341 Macular cyst, hole, or pseudohole, right eye: Secondary | ICD-10-CM | POA: Diagnosis not present

## 2020-09-30 DIAGNOSIS — T85398A Other mechanical complication of other ocular prosthetic devices, implants and grafts, initial encounter: Secondary | ICD-10-CM | POA: Diagnosis not present

## 2020-09-30 DIAGNOSIS — Z8669 Personal history of other diseases of the nervous system and sense organs: Secondary | ICD-10-CM | POA: Diagnosis not present

## 2020-09-30 DIAGNOSIS — Z48810 Encounter for surgical aftercare following surgery on the sense organs: Secondary | ICD-10-CM | POA: Diagnosis not present

## 2020-09-30 DIAGNOSIS — H3341 Traction detachment of retina, right eye: Secondary | ICD-10-CM | POA: Diagnosis not present

## 2020-10-28 DIAGNOSIS — H35341 Macular cyst, hole, or pseudohole, right eye: Secondary | ICD-10-CM | POA: Diagnosis not present

## 2020-10-28 DIAGNOSIS — H35373 Puckering of macula, bilateral: Secondary | ICD-10-CM | POA: Diagnosis not present

## 2020-10-28 DIAGNOSIS — H43812 Vitreous degeneration, left eye: Secondary | ICD-10-CM | POA: Diagnosis not present

## 2020-11-20 DIAGNOSIS — H35341 Macular cyst, hole, or pseudohole, right eye: Secondary | ICD-10-CM | POA: Diagnosis not present

## 2020-11-20 DIAGNOSIS — H43812 Vitreous degeneration, left eye: Secondary | ICD-10-CM | POA: Diagnosis not present

## 2020-12-09 DIAGNOSIS — B351 Tinea unguium: Secondary | ICD-10-CM | POA: Diagnosis not present

## 2020-12-09 DIAGNOSIS — Z125 Encounter for screening for malignant neoplasm of prostate: Secondary | ICD-10-CM | POA: Diagnosis not present

## 2020-12-09 DIAGNOSIS — Z87891 Personal history of nicotine dependence: Secondary | ICD-10-CM | POA: Diagnosis not present

## 2020-12-09 DIAGNOSIS — R7301 Impaired fasting glucose: Secondary | ICD-10-CM | POA: Diagnosis not present

## 2020-12-09 DIAGNOSIS — Z86010 Personal history of colonic polyps: Secondary | ICD-10-CM | POA: Diagnosis not present

## 2020-12-09 DIAGNOSIS — D508 Other iron deficiency anemias: Secondary | ICD-10-CM | POA: Diagnosis not present

## 2020-12-09 DIAGNOSIS — E7800 Pure hypercholesterolemia, unspecified: Secondary | ICD-10-CM | POA: Diagnosis not present

## 2020-12-09 DIAGNOSIS — I10 Essential (primary) hypertension: Secondary | ICD-10-CM | POA: Diagnosis not present

## 2020-12-09 DIAGNOSIS — Z0000 Encounter for general adult medical examination without abnormal findings: Secondary | ICD-10-CM | POA: Diagnosis not present

## 2021-01-01 DIAGNOSIS — H43812 Vitreous degeneration, left eye: Secondary | ICD-10-CM | POA: Diagnosis not present

## 2021-01-01 DIAGNOSIS — H35341 Macular cyst, hole, or pseudohole, right eye: Secondary | ICD-10-CM | POA: Diagnosis not present

## 2021-01-01 DIAGNOSIS — H35372 Puckering of macula, left eye: Secondary | ICD-10-CM | POA: Diagnosis not present

## 2021-01-28 DIAGNOSIS — D122 Benign neoplasm of ascending colon: Secondary | ICD-10-CM | POA: Diagnosis not present

## 2021-01-28 DIAGNOSIS — K635 Polyp of colon: Secondary | ICD-10-CM | POA: Diagnosis not present

## 2021-01-28 DIAGNOSIS — D124 Benign neoplasm of descending colon: Secondary | ICD-10-CM | POA: Diagnosis not present

## 2021-01-28 DIAGNOSIS — Z86010 Personal history of colonic polyps: Secondary | ICD-10-CM | POA: Diagnosis not present

## 2021-01-28 DIAGNOSIS — D123 Benign neoplasm of transverse colon: Secondary | ICD-10-CM | POA: Diagnosis not present

## 2021-03-04 DIAGNOSIS — B351 Tinea unguium: Secondary | ICD-10-CM | POA: Diagnosis not present

## 2021-03-26 DIAGNOSIS — H59811 Chorioretinal scars after surgery for detachment, right eye: Secondary | ICD-10-CM | POA: Diagnosis not present

## 2021-03-26 DIAGNOSIS — H35341 Macular cyst, hole, or pseudohole, right eye: Secondary | ICD-10-CM | POA: Diagnosis not present

## 2021-03-26 DIAGNOSIS — H35372 Puckering of macula, left eye: Secondary | ICD-10-CM | POA: Diagnosis not present

## 2021-03-26 DIAGNOSIS — H43812 Vitreous degeneration, left eye: Secondary | ICD-10-CM | POA: Diagnosis not present

## 2021-06-10 DIAGNOSIS — R7301 Impaired fasting glucose: Secondary | ICD-10-CM | POA: Diagnosis not present

## 2021-06-10 DIAGNOSIS — I10 Essential (primary) hypertension: Secondary | ICD-10-CM | POA: Diagnosis not present

## 2021-06-10 DIAGNOSIS — B351 Tinea unguium: Secondary | ICD-10-CM | POA: Diagnosis not present

## 2021-06-10 DIAGNOSIS — E7800 Pure hypercholesterolemia, unspecified: Secondary | ICD-10-CM | POA: Diagnosis not present
